# Patient Record
Sex: Female | Born: 2000 | Race: White | Hispanic: No | Marital: Single
Health system: Southern US, Community
[De-identification: ages and names within clinical notes are randomized; demographics above are authoritative.]

---
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
## 2021-05-07 IMAGING — DX LEFT RING FINGER 2+V
3 series · 3 of 3 positions shown · non-contrast
Comparison: None.

CLINICAL DATA: 18-year-old female status post skin tear, soft
tissue injury.

EXAM:
LEFT RING FINGER 2+V

[finger ap]
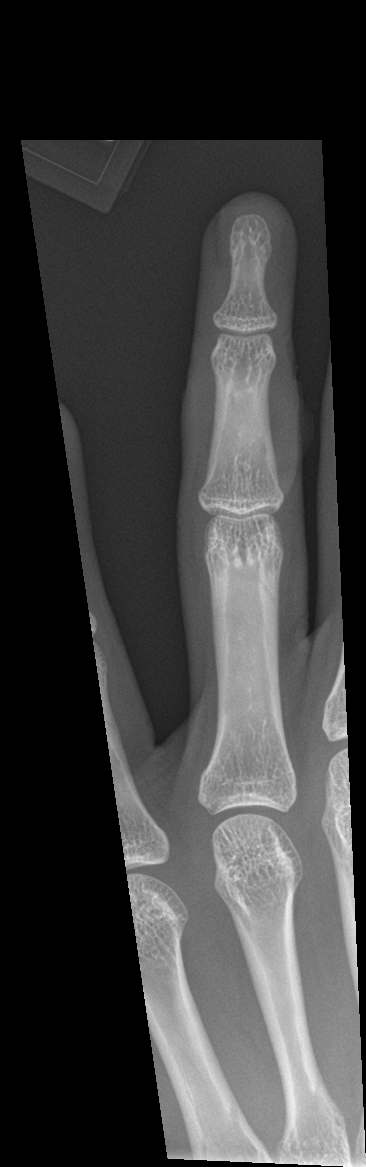

[finger obl]
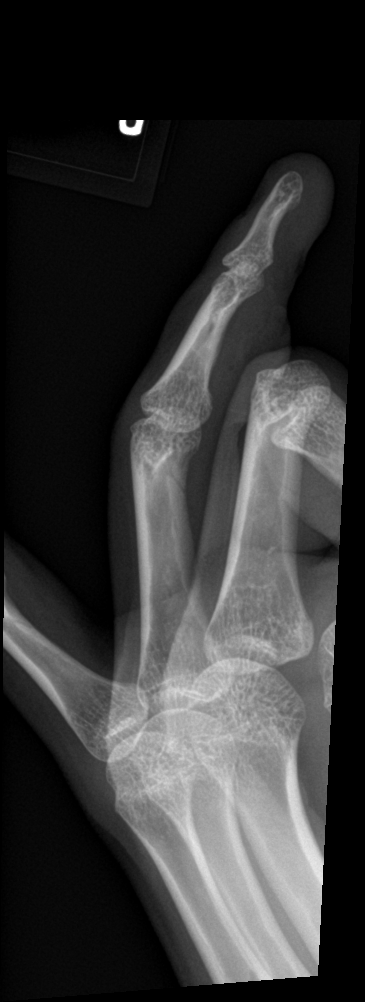

[finger lat]
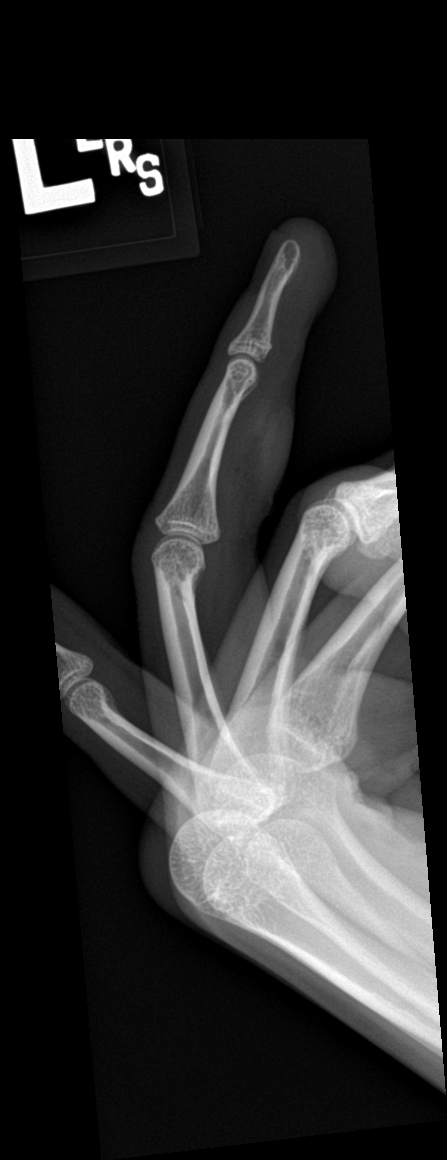

[3 of 3 positions shown; findings below may reference images not displayed]

FINDINGS: Bone mineralization is within normal limits. Normal joint spaces and
alignment. No osseous abnormality identified. There is soft tissue
injury with swelling along the volar aspect of the fourth finger. No
radiopaque foreign body identified.
IMPRESSION: Soft tissue injury with no osseous abnormality identified.

## 2022-08-05 ENCOUNTER — Ambulatory Visit: Payer: Self-pay | Admitting: Allergy and Immunology

## 2022-08-10 ENCOUNTER — Ambulatory Visit: Payer: Self-pay | Admitting: Allergy and Immunology
